# Patient Record
Sex: Male | Born: 1965 | Hispanic: No | Marital: Married | Smoking: Never smoker
Health system: Southern US, Community
[De-identification: ages and names within clinical notes are randomized; demographics above are authoritative.]

## PROBLEM LIST (undated history)

## (undated) DIAGNOSIS — K449 Diaphragmatic hernia without obstruction or gangrene: Secondary | ICD-10-CM

## (undated) DIAGNOSIS — J309 Allergic rhinitis, unspecified: Secondary | ICD-10-CM

## (undated) DIAGNOSIS — K219 Gastro-esophageal reflux disease without esophagitis: Secondary | ICD-10-CM

## (undated) DIAGNOSIS — K759 Inflammatory liver disease, unspecified: Secondary | ICD-10-CM

## (undated) DIAGNOSIS — J45909 Unspecified asthma, uncomplicated: Secondary | ICD-10-CM

## (undated) DIAGNOSIS — T7840XA Allergy, unspecified, initial encounter: Secondary | ICD-10-CM

## (undated) HISTORY — DX: Gastro-esophageal reflux disease without esophagitis: K21.9

## (undated) HISTORY — DX: Unspecified asthma, uncomplicated: J45.909

## (undated) HISTORY — DX: Allergic rhinitis, unspecified: J30.9

## (undated) HISTORY — DX: Diaphragmatic hernia without obstruction or gangrene: K44.9

## (undated) HISTORY — DX: Inflammatory liver disease, unspecified: K75.9

## (undated) HISTORY — DX: Gilbert syndrome: E80.4

## (undated) HISTORY — PX: NO PAST SURGERIES: SHX2092

## (undated) HISTORY — DX: Allergy, unspecified, initial encounter: T78.40XA

---

## 2004-05-30 HISTORY — PX: ESOPHAGOGASTRODUODENOSCOPY: SHX1529

## 2005-03-18 ENCOUNTER — Encounter: Admission: RE | Admit: 2005-03-18 | Discharge: 2005-03-18 | Payer: Self-pay | Admitting: Gastroenterology

## 2016-02-05 ENCOUNTER — Telehealth: Payer: Self-pay | Admitting: Internal Medicine

## 2016-02-05 NOTE — Telephone Encounter (Signed)
Received records from [facility]. Patient states that he is "not satisfied" with Dr. Evette CristalGanem and is requesting to see Dr. Leone PayorGessner. Patient is wanting to be seen for acid reflux. Records placed on Dr. Marvell FullerGessner's desk for review.

## 2016-02-08 NOTE — Telephone Encounter (Signed)
Records reviewed and accepted.  LM on vmail for patient to call back to schedule an OV

## 2016-02-12 ENCOUNTER — Telehealth: Payer: Self-pay

## 2016-02-12 NOTE — Telephone Encounter (Signed)
Left message for patient to call back  

## 2016-02-12 NOTE — Telephone Encounter (Signed)
-----   Message from Iva Booparl E Gessner, MD sent at 02/12/2016  5:31 AM EDT ----- Regarding: work in sooner Please contact him about his problems - former [facility] patient coming to us and see about getting him in sooner than next available  Can use a Friday 9/28 slot if needed  Thank you

## 2016-02-15 ENCOUNTER — Encounter: Payer: Self-pay | Admitting: Internal Medicine

## 2016-02-15 NOTE — Telephone Encounter (Signed)
Left message for patient to call back  

## 2016-02-16 NOTE — Telephone Encounter (Signed)
Patient notified of appt for 02/26/16 3:00

## 2016-02-26 ENCOUNTER — Encounter: Payer: Self-pay | Admitting: Internal Medicine

## 2016-02-26 ENCOUNTER — Ambulatory Visit (INDEPENDENT_AMBULATORY_CARE_PROVIDER_SITE_OTHER): Payer: 59 | Admitting: Internal Medicine

## 2016-02-26 VITALS — BP 100/68 | HR 80 | Ht 74.0 in | Wt 196.2 lb

## 2016-02-26 DIAGNOSIS — R252 Cramp and spasm: Secondary | ICD-10-CM | POA: Diagnosis not present

## 2016-02-26 DIAGNOSIS — Z1211 Encounter for screening for malignant neoplasm of colon: Secondary | ICD-10-CM

## 2016-02-26 DIAGNOSIS — J45909 Unspecified asthma, uncomplicated: Secondary | ICD-10-CM

## 2016-02-26 DIAGNOSIS — K219 Gastro-esophageal reflux disease without esophagitis: Secondary | ICD-10-CM | POA: Diagnosis not present

## 2016-02-26 DIAGNOSIS — K449 Diaphragmatic hernia without obstruction or gangrene: Secondary | ICD-10-CM | POA: Diagnosis not present

## 2016-02-26 MED ORDER — DEXLANSOPRAZOLE 30 MG PO CPDR: 30.0000 mg | DELAYED_RELEASE_CAPSULE | Freq: Every day | ORAL | 3 refills | Status: DC

## 2016-02-26 NOTE — Progress Notes (Signed)
DUC CROCKET 50 y.o. 1965-11-07 [id number]  Assessment & Plan:   1. Gastroesophageal reflux disease, esophagitis presence not specified   2. Hiatal hernia   3. Asthma, chronic, unspecified asthma severity, uncomplicated   4. Cramp of both lower extremities   5. Colon cancer screening     Patient is an signed about leg cramps, he seems to get good control of his reflux symptoms which she says are mainly triggering of asthma, with PPI especially 30 mg Dexilant. He is frustrated over having to take medication and the side effects.   Mg Oxide 400 bid to see if this relieves leg cramps Dexilant 30 mg daily Handout on laparoscopic fundoplication surgery provided he has expressed some interest though I explained he would need additional testing if he were to have a referral for this, i.e. manometry and pH probe testing. RTC next available  Colonoscopy for screening is appropriate, will schedule upon return, we'll hold off for right now as he could potentially need an EGD again. I requested the records of his prior EGD.   Subjective:   Chief Complaint: Reflux  HPI The patient is a very nice 50 year old man with a chronic history of reflux, he takes PPI therapy which relieves wheezing symptoms that he has. He has had a diagnosis of GERD ever since he had some chest pain problems a number of years ago, perhaps around 2007 he had an EGD by Dr. Evette Cristal, it demonstrated a small hiatus hernia. He's been on PPI therapy ever since including Prilosec Nexium AcipHex and the most effective medication for him has been dexilant, originally 60 mg then 30 mg daily with good control of his symptoms. However he had noticed leg cramps. He stopped this medication in the leg cramps went away. He has been taking a multivitamin but that doesn't really relieve the symptoms. He is back on this PPI as intermittent Prilosec did not work as well. Another opinion so he requested an appointment with me. There is no  dysphagia or unintentional weight loss. Does note that alcohol and caffeinated beverages years cause severe symptoms.  Allergies  Allergen Reactions  . Ampicillin-Sulbactam Sodium   . Erythromycin   . Sulfa Antibiotics    No outpatient prescriptions prior to visit.   No facility-administered medications prior to visit.    Past Medical History:  Diagnosis Date  . Allergic rhinitis   . Asthma   . GERD (gastroesophageal reflux disease)   . Gilbert's syndrome   . Hepatitis    ? when a child  . Hiatal hernia    Past Surgical History:  Procedure Laterality Date  . NO PAST SURGERIES     Social History   Social History  . Marital status: Married    Spouse name: N/A  . Number of children: 2  . Years of education: N/A   Occupational History  . engineer    Social History Main Topics  . Smoking status: Never Smoker  . Smokeless tobacco: Never Used  . Alcohol use Yes     Comment: social  . Drug use: No  . Sexual activity: Not Asked   Other Topics Concern  . None   Social History Narrative  . None   Family History  Problem Relation Age of Onset  . Heart attack Father   . CAD Father   . Cancer Father     laryngeal  . Hypertension Father   . Kidney Stones Brother        Review of  Systems As above. All other review of systems are negative.  Objective:   Physical Exam @BP  100/68 (BP Location: Left Arm, Patient Position: Sitting, Cuff Size: Normal)   Pulse 80   Ht 6\' 2"  (1.88 m) Comment: height measured without shoes  Wt 196 lb 4 oz (89 kg)   BMI 25.20 kg/m @  General:  Well-developed, well-nourished and in no acute distress Eyes:  anicteric. ENT:   Mouth and posterior pharynx free of lesions.  Neck:   supple w/o thyromegaly or mass.  Lungs: Clear to auscultation bilaterally. Heart:  S1S2, no rubs, murmurs, gallops. Abdomen:  soft, non-tender, no hepatosplenomegaly, hernia, or mass and BS+.   Lymph:  no cervical or supraclavicular  adenopathy. Extremities:   no edema, cyanosis or clubbing Skin   no rash. Neuro:  A&O x 3.  Psych:  appropriate mood and  Affect.   Data Reviewed:  Several years of [facility] notes.

## 2016-02-26 NOTE — Patient Instructions (Addendum)
   We will obtain your EGD report from Dr Evette CristalGanem for review.   Today we are giving you information to read on correction of acid reflux by laparoscopy.    We have sent the following medications to your pharmacy for you to pick up at your convenience: Dexilant   Per Dr Leone PayorGessner please take over the counter magnesium oxide 400mg  twice a day.    Follow up with Dr Leone PayorGessner on 05/05/2016 at 8:30AM     I appreciate the opportunity to care for you. Stan Headarl Gessner, MD, [facility]

## 2016-04-29 ENCOUNTER — Ambulatory Visit: Payer: Self-pay | Admitting: Internal Medicine

## 2016-05-04 ENCOUNTER — Encounter: Payer: Self-pay | Admitting: Gastroenterology

## 2016-05-05 ENCOUNTER — Encounter: Payer: Self-pay | Admitting: Internal Medicine

## 2016-05-05 ENCOUNTER — Ambulatory Visit (INDEPENDENT_AMBULATORY_CARE_PROVIDER_SITE_OTHER): Payer: 59 | Admitting: Internal Medicine

## 2016-05-05 VITALS — BP 100/60 | HR 72 | Ht 74.0 in | Wt 196.5 lb

## 2016-05-05 DIAGNOSIS — K219 Gastro-esophageal reflux disease without esophagitis: Secondary | ICD-10-CM

## 2016-05-05 DIAGNOSIS — Z1211 Encounter for screening for malignant neoplasm of colon: Secondary | ICD-10-CM

## 2016-05-05 NOTE — Progress Notes (Signed)
   Samuel Roy 50 y.o. 02/28/1966 [id number]  Assessment & Plan:   Encounter Diagnoses  Name Primary?  . Gastroesophageal reflux disease, esophagitis presence not specified Yes  . Colon cancer screening     Stay on dexilant + MG to treat cramps Screening colonoscopy - we discused all options. The risks and benefits as well as alternatives of endoscopic procedure(s) have been discussed and reviewed. All questions answered. The patient agrees to proceed.  I appreciate the opportunity to care for this patient.    Subjective:   Chief Complaint: f/u reflux  HPI Heartburn fairly well-controlled now 0n 30 mg Dexilant qd. occ breakthrough. Mg supplement relieves most of mm cramps. Overall he is satisfied.  Prior EGD 2006 small hiatal hernia and mild bx esophagitis  He says he has eliminated almost all caffeine  Medications, allergies, past medical history, past surgical history, family history and social history are reviewed and updated in the EMR.  Review of Systems As above  Objective:   Physical Exam BP 100/60 (BP Location: Left Arm, Patient Position: Sitting, Cuff Size: Normal)   Pulse 72   Ht 6\' 2"  (1.88 m)   Wt 196 lb 8 oz (89.1 kg)   BMI 25.23 kg/m  NAD  15 minutes time spent with patient > half in counseling coordination of care

## 2016-05-05 NOTE — Patient Instructions (Addendum)
   You have been scheduled for a colonoscopy. Please follow written instructions given to you at your visit today.  Please pick up your prep supplies at the pharmacy. If you use inhalers (even only as needed), please bring them with you on the day of your procedure. Your physician has requested that you go to www.startemmi.com and enter the access code given to you at your visit today. This web site gives a general overview about your procedure. However, you should still follow specific instructions given to you by our office regarding your preparation for the procedure.   We are providing you with a Dexilant coupon today.  I appreciate the opportunity to care for you. Stan Headarl Gessner, MD, [facility]

## 2016-06-16 ENCOUNTER — Encounter: Payer: Self-pay | Admitting: Internal Medicine

## 2016-06-30 ENCOUNTER — Encounter: Payer: Self-pay | Admitting: Internal Medicine

## 2016-06-30 ENCOUNTER — Ambulatory Visit (AMBULATORY_SURGERY_CENTER): Payer: 59 | Admitting: Internal Medicine

## 2016-06-30 VITALS — BP 94/60 | HR 69 | Temp 98.0°F | Resp 18 | Ht 74.0 in | Wt 196.0 lb

## 2016-06-30 DIAGNOSIS — Z1211 Encounter for screening for malignant neoplasm of colon: Secondary | ICD-10-CM | POA: Diagnosis present

## 2016-06-30 DIAGNOSIS — Z1212 Encounter for screening for malignant neoplasm of rectum: Secondary | ICD-10-CM | POA: Diagnosis not present

## 2016-06-30 MED ORDER — SODIUM CHLORIDE 0.9 % IV SOLN: 500.0000 mL | INTRAVENOUS | Status: AC

## 2016-06-30 NOTE — Patient Instructions (Addendum)
   The colonoscopy was normal. Next routine colonoscopy in 10 years - 2028.  I appreciate the opportunity to care for you. Samuel Booparl E. Brantlee Hinde, MD, FACG  YOU HAD AN ENDOSCOPIC PROCEDURE TODAY AT THE [facility]:   Refer to the procedure report that was given to you for any specific questions about what was found during the examination.  If the procedure report does not answer your questions, please call your gastroenterologist to clarify.  If you requested that your care partner not be given the details of your procedure findings, then the procedure report has been included in a sealed envelope for you to review at your convenience later.  YOU SHOULD EXPECT: Some feelings of bloating in the abdomen. Passage of more gas than usual.  Walking can help get rid of the air that was put into your GI tract during the procedure and reduce the bloating. If you had a lower endoscopy (such as a colonoscopy or flexible sigmoidoscopy) you may notice spotting of blood in your stool or on the toilet paper. If you underwent a bowel prep for your procedure, you may not have a normal bowel movement for a few days.  Please Note:  You might notice some irritation and congestion in your nose or some drainage.  This is from the oxygen used during your procedure.  There is no need for concern and it should clear up in a day or so.  SYMPTOMS TO REPORT IMMEDIATELY:   Following lower endoscopy (colonoscopy or flexible sigmoidoscopy):  Excessive amounts of blood in the stool  Significant tenderness or worsening of abdominal pains  Swelling of the abdomen that is new, acute  Fever of 100F or higher   For urgent or emergent issues, a gastroenterologist can be reached at any hour by calling [phone].   DIET:  We do recommend a small meal at first, but then you may proceed to your regular diet.  Drink plenty of fluids but you should avoid alcoholic beverages for 24 hours.  ACTIVITY:  You should plan  to take it easy for the rest of today and you should NOT DRIVE or use heavy machinery until tomorrow (because of the sedation medicines used during the test).    FOLLOW UP: Our staff will call the number listed on your records the next business day following your procedure to check on you and address any questions or concerns that you may have regarding the information given to you following your procedure. If we do not reach you, we will leave a message.  However, if you are feeling well and you are not experiencing any problems, there is no need to return our call.  We will assume that you have returned to your regular daily activities without incident.  If any biopsies were taken you will be contacted by phone or by letter within the next 1-3 weeks.  Please call us at [phone] if you have not heard about the biopsies in 3 weeks.    SIGNATURES/CONFIDENTIALITY: You and/or your care partner have signed paperwork which will be entered into your electronic medical record.  These signatures attest to the fact that that the information above on your After Visit Summary has been reviewed and is understood.  Full responsibility of the confidentiality of this discharge information lies with you and/or your care-partner.

## 2016-06-30 NOTE — Op Note (Signed)
[facility] Patient Name: Samuel Roy Procedure Date: 06/30/2016 8:00 AM MRN: [id number] Endoscopist: Iva Boop , MD Age: 51 Referring MD:  Date of Birth: 1965/12/09 Gender: Male Account #: [id number] Procedure:                Colonoscopy Indications:              Screening for colorectal malignant neoplasm Medicines:                Propofol per Anesthesia, Monitored Anesthesia Care Procedure:                Pre-Anesthesia Assessment:                           - Prior to the procedure, a History and Physical                            was performed, and patient medications and                            allergies were reviewed. The patient's tolerance of                            previous anesthesia was also reviewed. The risks                            and benefits of the procedure and the sedation                            options and risks were discussed with the patient.                            All questions were answered, and informed consent                            was obtained. Prior Anticoagulants: The patient has                            taken no previous anticoagulant or antiplatelet                            agents. ASA Grade Assessment: II - A patient with                            mild systemic disease. After reviewing the risks                            and benefits, the patient was deemed in                            satisfactory condition to undergo the procedure.                           After obtaining informed consent, the colonoscope  was passed under direct vision. Throughout the                            procedure, the patient's blood pressure, pulse, and                            oxygen saturations were monitored continuously. The                            Model CF-HQ190L [id number](SN#[id number]) scope was introduced                            through the anus and advanced to the the cecum,                             identified by appendiceal orifice and ileocecal                            valve. The quality of the bowel preparation was                            good. The colonoscopy was performed without                            difficulty. The patient tolerated the procedure                            well. The bowel preparation used was Miralax. The                            ileocecal valve, appendiceal orifice, and rectum                            were photographed. Scope In: 8:13:57 AM Scope Out: 8:26:56 AM Scope Withdrawal Time: 0 hours 11 minutes 4 seconds  Total Procedure Duration: 0 hours 12 minutes 59 seconds  Findings:                 The perianal and digital rectal examinations were                            normal. Pertinent negatives include normal prostate                            (size, shape, and consistency).                           The colon (entire examined portion) appeared normal.                           No additional abnormalities were found on                            retroflexion. Complications:            No immediate complications. Estimated  blood loss:                            None. Estimated Blood Loss:     Estimated blood loss: none. Recommendation:           - Repeat colonoscopy in 10 years for screening                            purposes.                           - Patient has a contact number available for                            emergencies. The signs and symptoms of potential                            delayed complications were discussed with the                            patient. Return to normal activities tomorrow.                            Written discharge instructions were provided to the                            patient.                           - Resume previous diet.                           - Continue present medications. Iva Boop, MD 06/30/2016 8:32:20 AM This report has been signed electronically.

## 2016-06-30 NOTE — Progress Notes (Signed)
A and O x3. Report to RN. Tolerated MAC anesthesia well.

## 2016-07-01 ENCOUNTER — Telehealth: Payer: Self-pay

## 2016-07-01 NOTE — Telephone Encounter (Signed)
  Follow up Call-  Call back number 06/30/2016  Post procedure Call Back phone  # [phone]  Permission to leave phone message Yes  Some recent data might be hidden     Patient questions:  Do you have a fever, pain , or abdominal swelling? No. Pain Score  0 *  Have you tolerated food without any problems? Yes.    Have you been able to return to your normal activities? Yes.    Do you have any questions about your discharge instructions: Diet   No. Medications  No. Follow up visit  No.  Do you have questions or concerns about your Care? No.  Actions: * If pain score is 4 or above: No action needed, pain <4.

## 2016-10-21 ENCOUNTER — Other Ambulatory Visit: Payer: Self-pay | Admitting: Internal Medicine

## 2016-10-21 ENCOUNTER — Telehealth: Payer: Self-pay | Admitting: Internal Medicine

## 2016-10-21 MED ORDER — DEXLANSOPRAZOLE 30 MG PO CPDR: 30.0000 mg | DELAYED_RELEASE_CAPSULE | Freq: Every day | ORAL | 3 refills | Status: AC

## 2016-10-21 NOTE — Telephone Encounter (Signed)
[facility] and prescription is not ready yet but it has been received. Left voicemail to explain to patient.

## 2016-10-21 NOTE — Telephone Encounter (Signed)
Spoke to patient and informed him we sent a 90 day Rx to [facility].

## 2020-04-14 ENCOUNTER — Encounter (HOSPITAL_COMMUNITY): Payer: Self-pay

## 2020-04-14 ENCOUNTER — Emergency Department (HOSPITAL_COMMUNITY): Payer: Managed Care, Other (non HMO)

## 2020-04-14 ENCOUNTER — Other Ambulatory Visit: Payer: Self-pay

## 2020-04-14 ENCOUNTER — Emergency Department (HOSPITAL_COMMUNITY)
Admission: EM | Admit: 2020-04-14 | Discharge: 2020-04-14 | Disposition: A | Discharge status: Home / Self Care | Payer: Managed Care, Other (non HMO) | Attending: Emergency Medicine | Admitting: Emergency Medicine

## 2020-04-14 DIAGNOSIS — J45909 Unspecified asthma, uncomplicated: Secondary | ICD-10-CM | POA: Diagnosis not present

## 2020-04-14 DIAGNOSIS — R531 Weakness: Secondary | ICD-10-CM | POA: Diagnosis not present

## 2020-04-14 DIAGNOSIS — Z7951 Long term (current) use of inhaled steroids: Secondary | ICD-10-CM | POA: Diagnosis not present

## 2020-04-14 DIAGNOSIS — R112 Nausea with vomiting, unspecified: Secondary | ICD-10-CM | POA: Diagnosis not present

## 2020-04-14 DIAGNOSIS — R42 Dizziness and giddiness: Secondary | ICD-10-CM | POA: Diagnosis present

## 2020-04-14 DIAGNOSIS — Z20822 Contact with and (suspected) exposure to covid-19: Secondary | ICD-10-CM | POA: Diagnosis not present

## 2020-04-14 LAB — BASIC METABOLIC PANEL
Anion gap: 9 (ref 5–15)
BUN: 15 mg/dL (ref 6–20)
CO2: 22 mmol/L (ref 22–32)
Calcium: 9.2 mg/dL (ref 8.9–10.3)
Chloride: 104 mmol/L (ref 98–111)
Creatinine, Ser: 0.91 mg/dL (ref 0.61–1.24)
GFR, Estimated: >60 mL/min (ref >60)
Glucose, Bld: 131 mg/dL — ABNORMAL HIGH (ref 70–99)
Potassium: 3.6 mmol/L (ref 3.5–5.1)
Sodium: 135 mmol/L (ref 135–145)

## 2020-04-14 LAB — CBC
HCT: 42.4 % (ref 39.0–52.0)
Hemoglobin: 14.8 g/dL (ref 13.0–17.0)
MCH: 30.7 pg (ref 26.0–34.0)
MCHC: 34.9 g/dL (ref 30.0–36.0)
MCV: 88 fL (ref 80.0–100.0)
Platelets: 194 10*3/uL (ref 150–400)
RBC: 4.82 MIL/uL (ref 4.22–5.81)
RDW: 10.6 % — ABNORMAL LOW (ref 11.5–15.5)
WBC: 6.1 10*3/uL (ref 4.0–10.5)
nRBC: 0 % (ref 0.0–0.2)

## 2020-04-14 LAB — URINALYSIS, ROUTINE W REFLEX MICROSCOPIC
Bilirubin Urine: NEGATIVE
Glucose, UA: NEGATIVE mg/dL
Hgb urine dipstick: NEGATIVE
Ketones, ur: NEGATIVE mg/dL
Leukocytes,Ua: NEGATIVE
Nitrite: NEGATIVE
Protein, ur: NEGATIVE mg/dL
Specific Gravity, Urine: 1.021 (ref 1.005–1.030)
pH: 8 (ref 5.0–8.0)

## 2020-04-14 LAB — RESPIRATORY PANEL BY RT PCR (FLU A&B, COVID)
Influenza A by PCR: NEGATIVE
Influenza B by PCR: NEGATIVE
SARS Coronavirus 2 by RT PCR: NEGATIVE

## 2020-04-14 LAB — CBG MONITORING, ED: Glucose-Capillary: 146 mg/dL — ABNORMAL HIGH (ref 70–99)

## 2020-04-14 MED ORDER — ONDANSETRON 4 MG PO TBDP: 4.0000 mg | ORAL_TABLET | Freq: Three times a day (TID) | ORAL | 0 refills | Status: AC | PRN

## 2020-04-14 MED ORDER — MECLIZINE HCL 25 MG PO TABS
25.0000 mg | ORAL_TABLET | Freq: Once | ORAL | Status: AC
  Administered 2020-04-14: 25 mg via ORAL
  Filled 2020-04-14: qty 1

## 2020-04-14 MED ORDER — MECLIZINE HCL 25 MG PO TABS: 25.0000 mg | ORAL_TABLET | Freq: Three times a day (TID) | ORAL | 0 refills | Status: AC | PRN

## 2020-04-14 MED ORDER — MECLIZINE HCL 25 MG PO TABS: 25.0000 mg | ORAL_TABLET | Freq: Once | ORAL | Status: DC

## 2020-04-14 MED ORDER — ONDANSETRON HCL 4 MG/2ML IJ SOLN: 4.0000 mg | Freq: Once | INTRAMUSCULAR | Status: DC

## 2020-04-14 MED ORDER — LORAZEPAM 2 MG/ML IJ SOLN
1.0000 mg | Freq: Once | INTRAMUSCULAR | Status: AC
  Administered 2020-04-14: 1 mg via INTRAVENOUS
  Filled 2020-04-14: qty 1

## 2020-04-14 MED ORDER — SODIUM CHLORIDE 0.9 % IV BOLUS
1000.0000 mL | Freq: Once | INTRAVENOUS | Status: AC
  Administered 2020-04-14: 1000 mL via INTRAVENOUS

## 2020-04-14 NOTE — Discharge Instructions (Signed)
Please pick up medications and take as needed for symptom relief Follow up with both your PCP and neurology regarding your ED visit. Neurology may take some time to schedule an appointment and you can see your PCP in the meantime.   Return to the ED for any worsening symptoms including worsening dizziness, persistent vomiting, speech changes, confusion, weakness/numbness on one side of your body, severe headache, fevers > 100.4, or any other new/concerning symptoms

## 2020-04-14 NOTE — ED Triage Notes (Signed)
Patient arrived stating he woke up from his sleep and felt like the room was spinning. Reports light sensitivity and reports only feeling comfortable looking forward. Has had nausea and vomiting. States some tingling in his fingers.

## 2020-04-14 NOTE — ED Provider Notes (Signed)
[facility]-EMERGENCY DEPT Provider Note   CSN: [id number] Arrival date & time: 04/14/20  0556     History Chief Complaint  Patient presents with  . Weakness    Samuel Roy is a 54 y.o. male with PMHx asthma, allergic rhinitis, GERD, Gilbert's syndrome who presents to the ED with complaint of sudden onset, constant, room spinning dizziness that woke him up out of his sleep earlier this morning around 1 AM. Pt reports worsening dizziness with movement of any kind. He also reports nausea and TNTC episodes of NBNB emesis. Pt reports any time he vomits he feels a tingling sensation in his fingers and toes. He has never had symptoms like this before. Felt fine upon going to bed last night. Pt did not take anything for his symptoms prior to arrival. He does mention he had a lot of sneezing earlier this week otherwise felt at baseline. Denies fevers, chills, headache, blurry vision, double vision, unilateral weakness/numbness, abdominal pain, cough, SOB, confusion, speech changes, or any other associated symptoms.   The history is provided by the patient and medical records.       Past Medical History:  Diagnosis Date  . Allergic rhinitis   . Allergy   . Asthma   . GERD (gastroesophageal reflux disease)   . Gilbert's syndrome   . Hepatitis    ? when a child  . Hiatal hernia     There are no problems to display for this patient.   Past Surgical History:  Procedure Laterality Date  . ESOPHAGOGASTRODUODENOSCOPY  2006  . NO PAST SURGERIES         Family History  Problem Relation Age of Onset  . Heart attack Father   . CAD Father   . Cancer Father        laryngeal  . Hypertension Father   . Kidney Stones Brother     Social History   Tobacco Use  . Smoking status: Never Smoker  . Smokeless tobacco: Never Used  Substance Use Topics  . Alcohol use: Yes    Comment: social  . Drug use: No    Home Medications Prior to Admission medications    Medication Sig Start Date End Date Taking? Authorizing Provider  albuterol (PROVENTIL HFA;VENTOLIN HFA) 108 (90 Base) MCG/ACT inhaler Inhale 1 puff into the lungs daily as needed for wheezing or shortness of breath.    [provider]  cetirizine-pseudoephedrine (ZYRTEC-D) 5-120 MG tablet Take 1 tablet by mouth daily as needed for allergies.    [provider]  Dexlansoprazole (DEXILANT) 30 MG capsule Take 1 capsule (30 mg total) by mouth daily. 10/21/16   Iva Boop, MD  Fluticasone-Salmeterol (ADVAIR) 100-50 MCG/DOSE AEPB Inhale 1 puff into the lungs 2 (two) times daily.    [provider]  meclizine (ANTIVERT) 25 MG tablet Take 1 tablet (25 mg total) by mouth 3 (three) times daily as needed for dizziness. 04/14/20   Tanda Rockers, PA-C  Multiple Vitamin (MULTIVITAMIN) tablet Take 1 tablet by mouth daily.    [provider]  ondansetron (ZOFRAN ODT) 4 MG disintegrating tablet Take 1 tablet (4 mg total) by mouth every 8 (eight) hours as needed for nausea or vomiting. 04/14/20   Tanda Rockers, PA-C    Allergies    Ampicillin-sulbactam sodium, Erythromycin, and Sulfa antibiotics  Review of Systems   Review of Systems  Constitutional: Negative for chills and fever.  Eyes: Negative for visual disturbance.  Respiratory: Negative for cough and  shortness of breath.   Gastrointestinal: Positive for nausea and vomiting. Negative for abdominal pain, constipation and diarrhea.  Neurological: Positive for dizziness. Negative for syncope, facial asymmetry, speech difficulty, weakness, numbness and headaches.  All other systems reviewed and are negative.   Physical Exam Updated Vital Signs BP 134/89 (BP Location: Right Arm)   Pulse 99   Temp 98.1 F (36.7 C) (Oral)   Resp 19   Ht 6\' 3"  (1.905 m)   Wt 81.6 kg   SpO2 100%   BMI 22.50 kg/m   Physical Exam Vitals and nursing note reviewed.  Constitutional:      Appearance: He is not diaphoretic.      Comments: Uncomfortable appearing male; sitting in darkened room with towel over his eyes  HENT:     Head: Normocephalic and atraumatic.     Right Ear: Tympanic membrane normal.     Left Ear: Tympanic membrane normal.  Eyes:     Extraocular Movements: Extraocular movements intact.     Conjunctiva/sclera: Conjunctivae normal.     Pupils: Pupils are equal, round, and reactive to light.     Comments: No nystagmus appreciated  Cardiovascular:     Rate and Rhythm: Normal rate and regular rhythm.     Pulses: Normal pulses.  Pulmonary:     Effort: Pulmonary effort is normal.     Breath sounds: Normal breath sounds. No wheezing or rales.  Abdominal:     Palpations: Abdomen is soft.     Tenderness: There is no abdominal tenderness. There is no guarding or rebound.  Musculoskeletal:     Cervical back: Neck supple. No rigidity.  Skin:    General: Skin is warm and dry.  Neurological:     Mental Status: He is alert.     Comments: CN 3-12 grossly intact A&O x4 GCS 15 Sensation and strength intact Gait nonataxic including with tandem walking Coordination with finger-to-nose WNL Neg romberg, neg pronator drift     ED Results / Procedures / Treatments   Labs (all labs ordered are listed, but only abnormal results are displayed) Labs Reviewed  BASIC METABOLIC PANEL - Abnormal; Notable for the following components:      Result Value   Glucose, Bld 131 (*)    All other components within normal limits  CBC - Abnormal; Notable for the following components:   RDW 10.6 (*)    All other components within normal limits  CBG MONITORING, ED - Abnormal; Notable for the following components:   Glucose-Capillary 146 (*)    All other components within normal limits  RESPIRATORY PANEL BY RT PCR (FLU A&B, COVID)  URINALYSIS, ROUTINE W REFLEX MICROSCOPIC    EKG EKG Interpretation  Date/Time:  Tuesday April 14 2020 06:05:38 EST Ventricular Rate:  99 PR Interval:    QRS Duration: 88 QT  Interval:  381 QTC Calculation: 489 R Axis:   60 Text Interpretation: Normal sinus rhythm Borderline prolonged QT interval No old tracing to compare Confirmed by [id number] (Devoria Albe) on 04/14/2020 6:13:53 AM   Radiology CT Head Wo Contrast  Result Date: 04/14/2020 CLINICAL DATA:  Dizziness, nonspecific. EXAM: CT HEAD WITHOUT CONTRAST TECHNIQUE: Contiguous axial images were obtained from the base of the skull through the vertex without intravenous contrast. COMPARISON:  No pertinent prior exams are available for comparison. FINDINGS: Brain: Cerebral volume is normal for age. There is no acute intracranial hemorrhage. No demarcated cortical infarct. No extra-axial fluid collection. No evidence of intracranial mass. No midline shift. Vascular:  No hyperdense vessel. Skull: Normal. Negative for fracture or focal lesion. Sinuses/Orbits: Visualized orbits show no acute finding. Mild ethmoid and sphenoid sinus mucosal thickening. IMPRESSION: No evidence of acute intracranial abnormality. Ethmoid and sphenoid sinusitis. Electronically Signed   By: Jackey LogeKyle  Golden DO   On: 04/14/2020 07:47    Procedures Procedures (including critical care time)  Medications Ordered in ED Medications  sodium chloride 0.9 % bolus 1,000 mL (0 mLs Intravenous Stopped 04/14/20 0928)  LORazepam (ATIVAN) injection 1 mg (1 mg Intravenous Given 04/14/20 0806)  meclizine (ANTIVERT) tablet 25 mg (25 mg Oral Given 04/14/20 [id number])    ED Course  I have reviewed the triage vital signs and the nursing notes.  Pertinent labs & imaging results that were available during my care of the patient were reviewed by me and considered in my medical decision making (see chart for details).    MDM Rules/Calculators/A&P                          54 year old male presents to the ED today complaining of sudden onset room spinning dizziness that woke him out of his sleep earlier today (1 AM) with associated nausea and nonbloody nonbilious emesis.   Is worse with any movement of any kind.  Vitals are stable on arrival, febrile, nontachycardic nontachypneic.  Pressure normotensive at 134/89.  As I enter the room patient is sitting in a darkened room with a towel over his head and emesis bag in hand.  He appears uncomfortable.  He has no obvious focal neuro deficits on exam today and no meningeal signs; denies headache, confusion, speech changes, unilateral weakness or numbness. There are no signs of nystagmus however given that he does not want to move his head around there is concern for benign positional vertigo. In the setting of new symptoms will plan for CT Head to assess for bleed causing sudden symptoms; may consider MRI as well if any abnormality on CT scan/if no improvement with treatment. Doubt stroke at this time however symptoms have been ongoing for > 5 hours at this time. Will treat with fluids and ativan with reevaluation once pt is more comfortable. He does mention having increased amount of sneezing earlier in the week prior to sx starting - TMs clear on exam; no signs of infection causing symptoms. Will swab for COVID.   CBC without leukocytosis. Hgb stable at 14.8.  BMP with glucose 131. No other findings a tthis time.  U/A negative for infection.  CT Head negative  Nursing staff reports pt's wife is in the room and has questions - have updated her on the exam findings at this time and treatment course. Fluids still running at this time however pt reports improvement in symptoms after fluids and ativan. Has not had anymore nausea or vomiting. Will plan to reassess once fluids are done running and ambulate patient; if he is able to ambulate without difficulty will plan to discharge with meclizine prescription and PCP/neurology follow up.   Pt ambulated successfully without unstable gait. Reports improvement in symptoms however still mildly dizzy. Will fluid challenge at this time and provide oral meclizine.   Pt able to tolerate  water and graham crackers without difficulty. Will discharge home with meclizine prescription and close PCP/neuro follow up. Pt also requesting nausea medication; will provide. Strict return precautions discussed today - pt is in agreement with plan and stable for discharge.   This note was prepared using [employer] voice recognition  software and may include unintentional dictation errors due to the inherent limitations of voice recognition software.  Final Clinical Impression(s) / ED Diagnoses Final diagnoses:  Vertigo    Rx / DC Orders ED Discharge Orders         Ordered    meclizine (ANTIVERT) 25 MG tablet  3 times daily PRN        04/14/20 1007    ondansetron (ZOFRAN ODT) 4 MG disintegrating tablet  Every 8 hours PRN        04/14/20 1007           Discharge Instructions     Please pick up medications and take as needed for symptom relief Follow up with both your PCP and neurology regarding your ED visit. Neurology may take some time to schedule an appointment and you can see your PCP in the meantime.   Return to the ED for any worsening symptoms including worsening dizziness, persistent vomiting, speech changes, confusion, weakness/numbness on one side of your body, severe headache, fevers > 100.4, or any other new/concerning symptoms       Tanda Rockers, PA-C 04/14/20 1009    Gwyneth Sprout, MD 04/16/20 1019

## 2020-07-02 ENCOUNTER — Ambulatory Visit: Payer: Self-pay | Admitting: Neurology

## 2022-05-25 IMAGING — CT CT HEAD W/O CM
2 series · 15 of 40 positions shown, 18 images · non-contrast
Comparison: No pertinent prior exams are available for comparison.

CLINICAL DATA: Dizziness, nonspecific.

EXAM:
CT HEAD WITHOUT CONTRAST
TECHNIQUE: Contiguous axial images were obtained from the base of the skull
through the vertex without intravenous contrast.

[Series 2: head wo · axial · 0.47mm/px · z∈[-193,-53]mm · 12 of 34 slices shown, 15 images]
[im 3/34  brain]
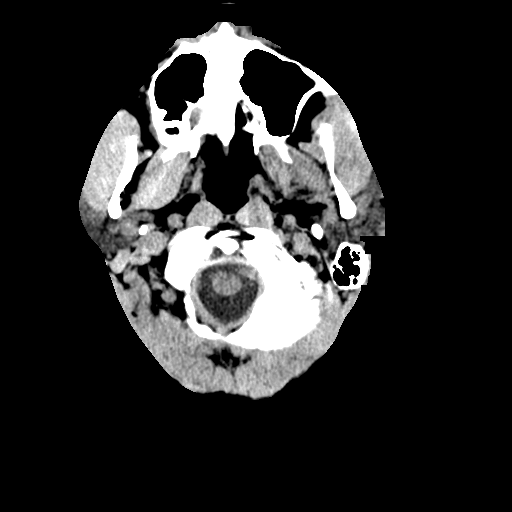
[im 3/34  bone]
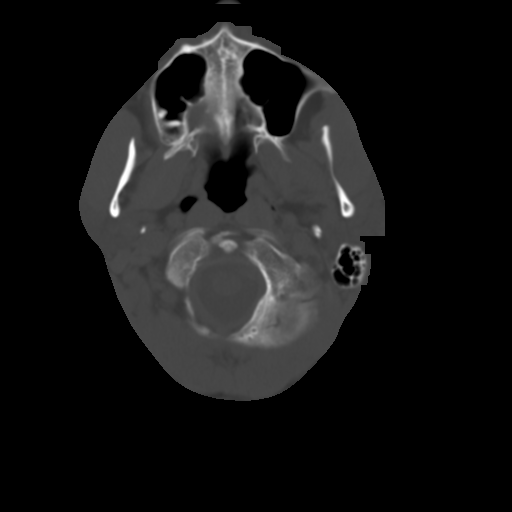
[im 5/34  brain]
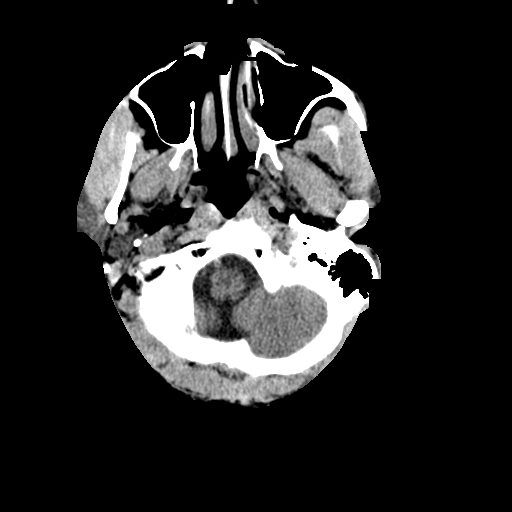
[im 7/34  brain]
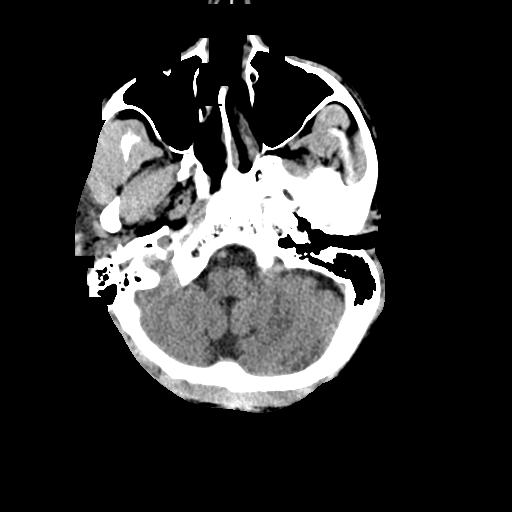
[im 11/34  brain]
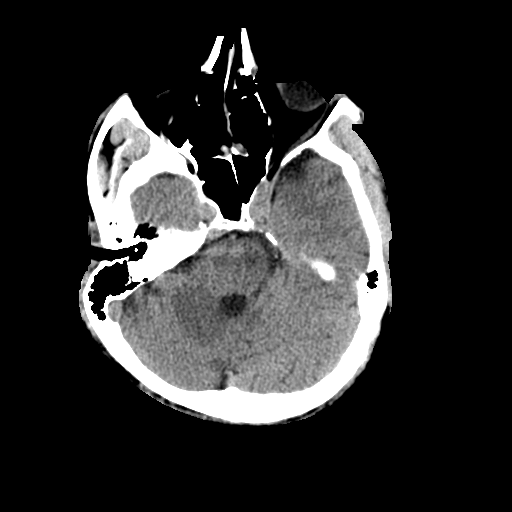
[im 13/34  brain]
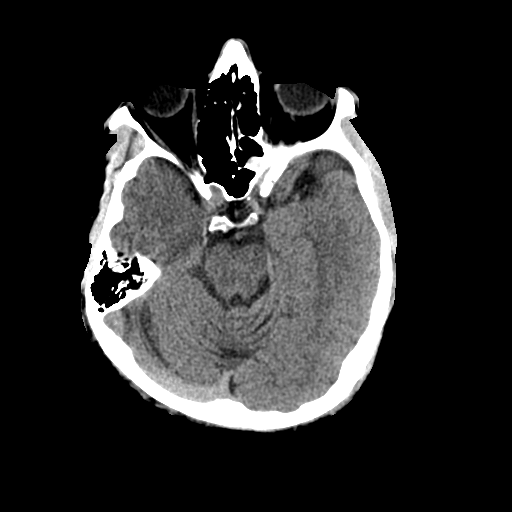
[im 13/34  bone]
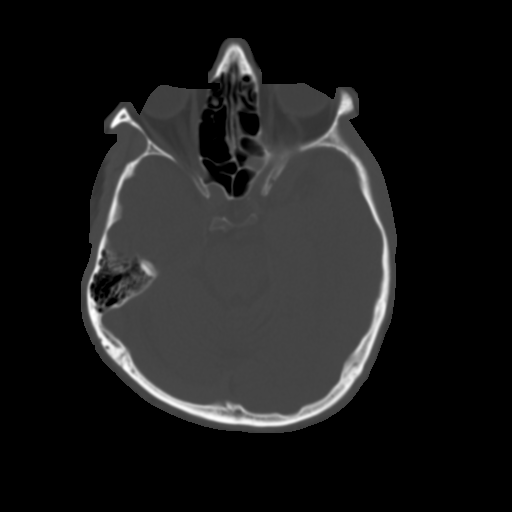
[im 15/34  brain]
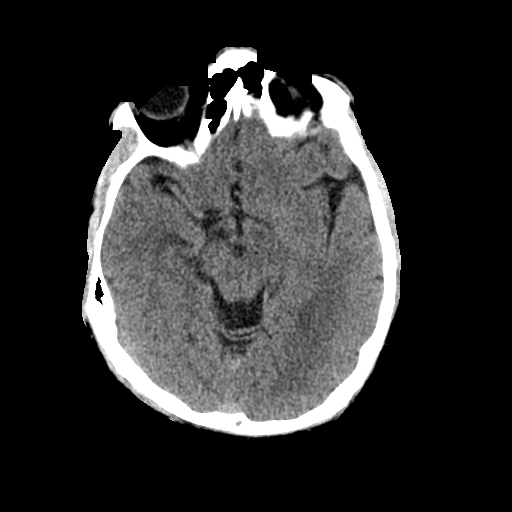
[im 19/34  brain]
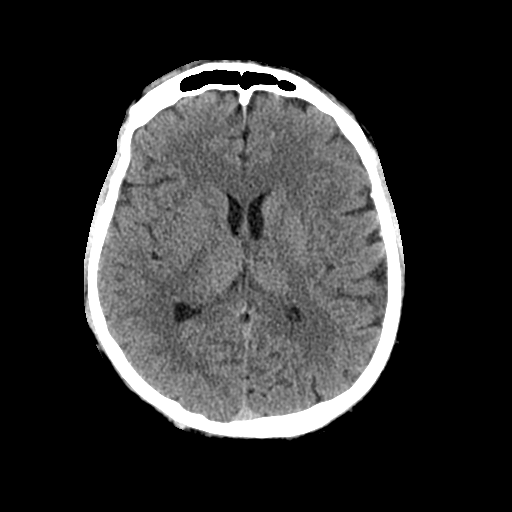
[im 21/34  brain]
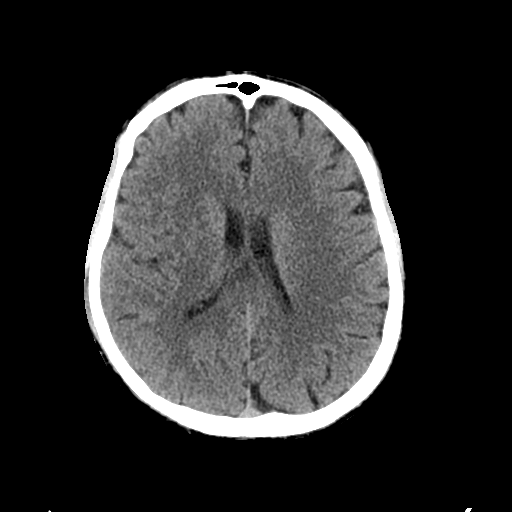
[im 23/34  brain]
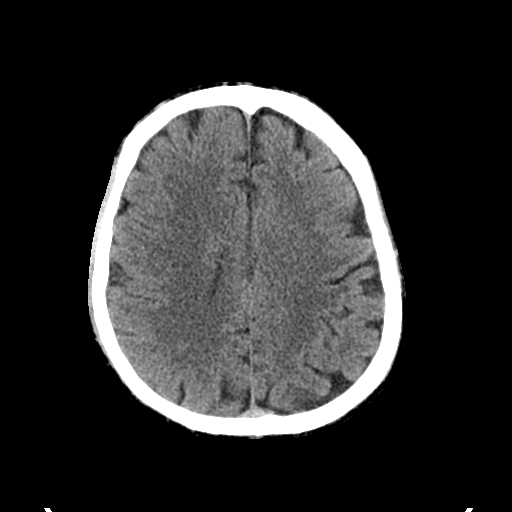
[im 23/34  bone]
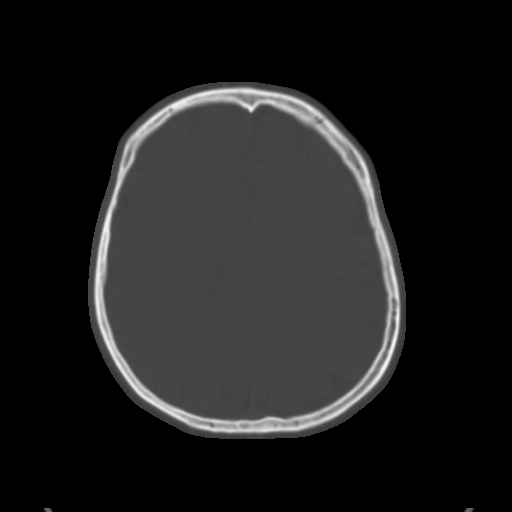
[im 27/34  brain]
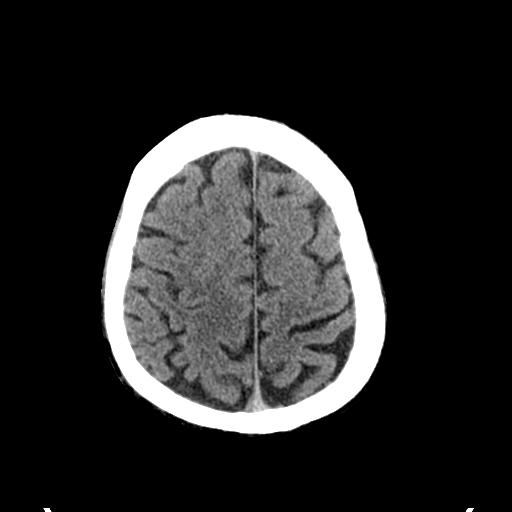
[im 29/34  brain]
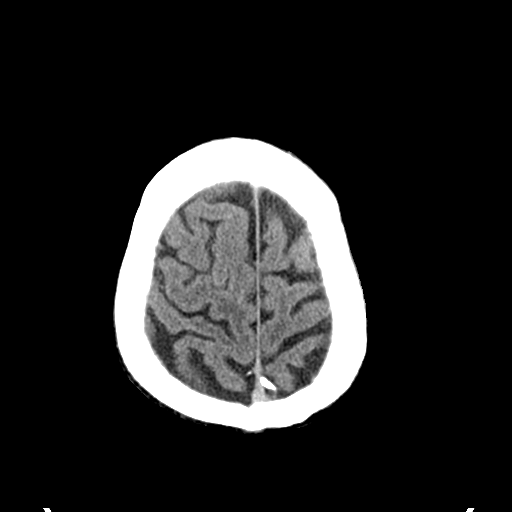
[im 31/34  brain]
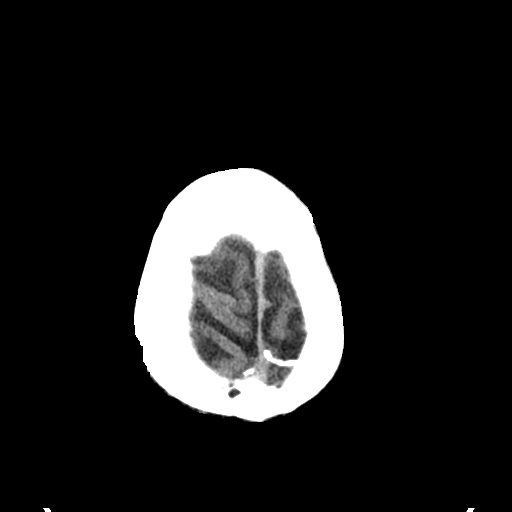

[Series 4: coronal soft tissue · coronal · 0.32mm/px · 3 of 84 slices shown]
[im 30/84  brain]
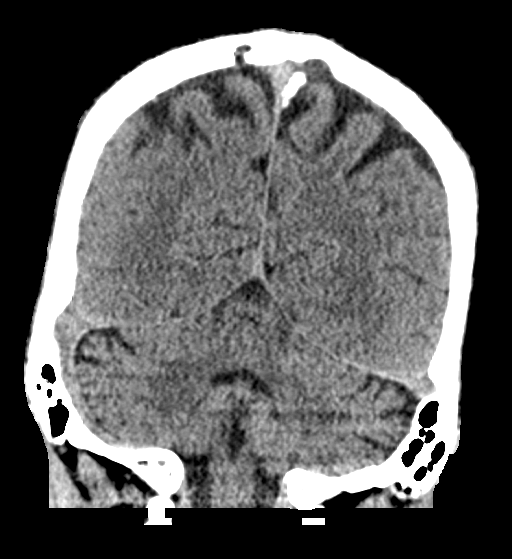
[im 38/84  brain]
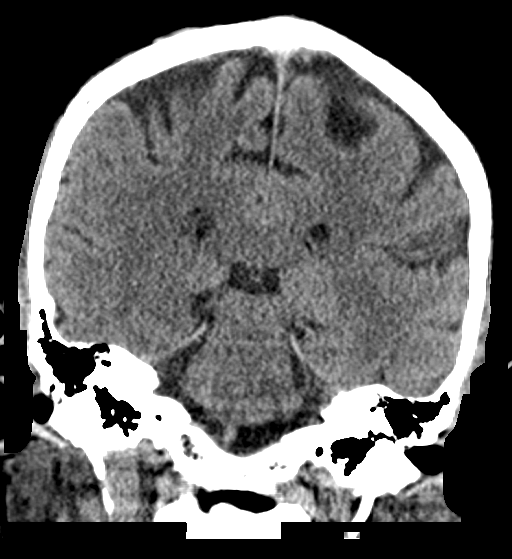
[im 46/84  brain]
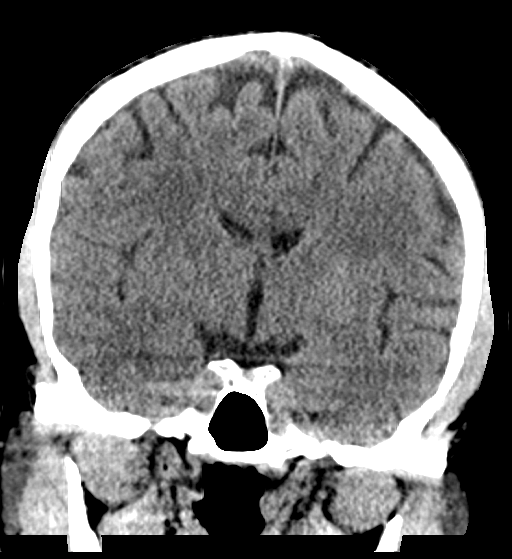

[15 of 40 positions shown; findings below may reference images not displayed]

FINDINGS: Brain:

Cerebral volume is normal for age.

There is no acute intracranial hemorrhage.

No demarcated cortical infarct.

No extra-axial fluid collection.

No evidence of intracranial mass.

No midline shift.

Vascular: No hyperdense vessel.

Skull: Normal. Negative for fracture or focal lesion.

Sinuses/Orbits: Visualized orbits show no acute finding. Mild
ethmoid and sphenoid sinus mucosal thickening.
IMPRESSION: No evidence of acute intracranial abnormality.

Ethmoid and sphenoid sinusitis.

## 2023-07-31 ENCOUNTER — Emergency Department (HOSPITAL_COMMUNITY)

## 2023-07-31 ENCOUNTER — Emergency Department (HOSPITAL_COMMUNITY)
Admission: EM | Admit: 2023-07-31 | Discharge: 2023-07-31 | Disposition: A | Discharge status: Home / Self Care | Attending: Emergency Medicine | Admitting: Emergency Medicine

## 2023-07-31 DIAGNOSIS — N320 Bladder-neck obstruction: Secondary | ICD-10-CM | POA: Diagnosis not present

## 2023-07-31 DIAGNOSIS — R10A0 Flank pain, unspecified side: Secondary | ICD-10-CM

## 2023-07-31 DIAGNOSIS — R109 Unspecified abdominal pain: Secondary | ICD-10-CM

## 2023-07-31 LAB — CBC WITH DIFFERENTIAL/PLATELET
Abs Immature Granulocytes: 0.01 10*3/uL (ref 0.00–0.07)
Basophils Absolute: 0 10*3/uL (ref 0.0–0.1)
Basophils Relative: 1 %
Eosinophils Absolute: 0.1 10*3/uL (ref 0.0–0.5)
Eosinophils Relative: 1 %
HCT: 38.3 % — ABNORMAL LOW (ref 39.0–52.0)
Hemoglobin: 12.6 g/dL — ABNORMAL LOW (ref 13.0–17.0)
Immature Granulocytes: 0 %
Lymphocytes Relative: 13 %
Lymphs Abs: 0.6 10*3/uL — ABNORMAL LOW (ref 0.7–4.0)
MCH: 30.1 pg (ref 26.0–34.0)
MCHC: 32.9 g/dL (ref 30.0–36.0)
MCV: 91.4 fL (ref 80.0–100.0)
Monocytes Absolute: 0.4 10*3/uL (ref 0.1–1.0)
Monocytes Relative: 7 %
Neutro Abs: 3.8 10*3/uL (ref 1.7–7.7)
Neutrophils Relative %: 78 %
Platelets: 150 10*3/uL (ref 150–400)
RBC: 4.19 MIL/uL — ABNORMAL LOW (ref 4.22–5.81)
RDW: 11.1 % — ABNORMAL LOW (ref 11.5–15.5)
WBC: 4.9 10*3/uL (ref 4.0–10.5)
nRBC: 0 % (ref 0.0–0.2)

## 2023-07-31 LAB — COMPREHENSIVE METABOLIC PANEL
ALT: 31 U/L (ref 0–44)
AST: 25 U/L (ref 15–41)
Albumin: 3.8 g/dL (ref 3.5–5.0)
Alkaline Phosphatase: 51 U/L (ref 38–126)
Anion gap: 8 (ref 5–15)
BUN: 32 mg/dL — ABNORMAL HIGH (ref 6–20)
CO2: 21 mmol/L — ABNORMAL LOW (ref 22–32)
Calcium: 9 mg/dL (ref 8.9–10.3)
Chloride: 110 mmol/L (ref 98–111)
Creatinine, Ser: 1.62 mg/dL — ABNORMAL HIGH (ref 0.61–1.24)
GFR, Estimated: 49 mL/min — ABNORMAL LOW (ref >60)
Glucose, Bld: 121 mg/dL — ABNORMAL HIGH (ref 70–99)
Potassium: 3.9 mmol/L (ref 3.5–5.1)
Sodium: 139 mmol/L (ref 135–145)
Total Bilirubin: 2.7 mg/dL — ABNORMAL HIGH (ref 0.0–1.2)
Total Protein: 6.8 g/dL (ref 6.5–8.1)

## 2023-07-31 LAB — URINALYSIS, ROUTINE W REFLEX MICROSCOPIC
Bilirubin Urine: NEGATIVE
Glucose, UA: NEGATIVE mg/dL
Hgb urine dipstick: NEGATIVE
Ketones, ur: NEGATIVE mg/dL
Leukocytes,Ua: NEGATIVE
Nitrite: NEGATIVE
Protein, ur: NEGATIVE mg/dL
Specific Gravity, Urine: 1.01 (ref 1.005–1.030)
pH: 5 (ref 5.0–8.0)

## 2023-07-31 LAB — LIPASE, BLOOD: Lipase: 30 U/L (ref 11–51)

## 2023-07-31 MED ORDER — SODIUM CHLORIDE 0.9 % IV BOLUS
1000.0000 mL | Freq: Once | INTRAVENOUS | Status: AC
  Administered 2023-07-31: 1000 mL via INTRAVENOUS

## 2023-07-31 MED ORDER — ONDANSETRON HCL 4 MG/2ML IJ SOLN
4.0000 mg | Freq: Once | INTRAMUSCULAR | Status: AC
  Administered 2023-07-31: 4 mg via INTRAVENOUS
  Filled 2023-07-31: qty 2

## 2023-07-31 MED ORDER — KETOROLAC TROMETHAMINE 15 MG/ML IJ SOLN
15.0000 mg | Freq: Once | INTRAMUSCULAR | Status: DC
  Filled 2023-07-31: qty 1

## 2023-07-31 MED ORDER — HYDROMORPHONE HCL 1 MG/ML IJ SOLN
1.0000 mg | Freq: Once | INTRAMUSCULAR | Status: DC
  Filled 2023-07-31: qty 1

## 2023-07-31 NOTE — ED Provider Notes (Signed)
  Physical Exam  BP (!) 167/79 (BP Location: Left Arm)   Pulse 74   Temp 98.2 F (36.8 C) (Oral)   Resp 20   Ht 6\' 3"  (1.905 m)   Wt 86.2 kg   SpO2 99%   BMI 23.75 kg/m   Physical Exam  Procedures  Procedures  ED Course / MDM     See Dr. Lanetta Inch note for prior hx, physical and care.   UA without infection.  Again discussed recommendation for foley catheter placement given increased Cr from prior values, obstructive signs on CT and elevated post-void residual.  Discussed risks of renal failure, including risks that renal failure leads to hyperkalemia and death.  He declines catheter and would like to follow up with Urology. Patient discharged in stable condition with understanding of reasons to return.    Alvira Monday, MD 07/31/23 2130

## 2023-07-31 NOTE — ED Provider Notes (Signed)
 [facility]PARTMENT AT [facility] Provider Note   CSN: [id number] Arrival date & time: 07/31/23  0507     History  Chief Complaint  Patient presents with   Flank Pain    Samuel Roy is a 58 y.o. male.  58 yo M with a chief complaints of right flank pain.  Patient is unable or unwilling to give me any further history.  Just tells me it hurts a lot to his right flank.   Flank Pain       Home Medications Prior to Admission medications   Medication Sig Start Date End Date Taking? Authorizing Provider  albuterol (PROVENTIL HFA;VENTOLIN HFA) 108 (90 Base) MCG/ACT inhaler Inhale 1 puff into the lungs daily as needed for wheezing or shortness of breath.    [provider]  cetirizine-pseudoephedrine (ZYRTEC-D) 5-120 MG tablet Take 1 tablet by mouth daily as needed for allergies.    [provider]  Dexlansoprazole (DEXILANT) 30 MG capsule Take 1 capsule (30 mg total) by mouth daily. 10/21/16   Iva Boop, MD  Fluticasone-Salmeterol (ADVAIR) 100-50 MCG/DOSE AEPB Inhale 1 puff into the lungs 2 (two) times daily.    [provider]  meclizine (ANTIVERT) 25 MG tablet Take 1 tablet (25 mg total) by mouth 3 (three) times daily as needed for dizziness. 04/14/20   Tanda Rockers, PA-C  Multiple Vitamin (MULTIVITAMIN) tablet Take 1 tablet by mouth daily.    [provider]  ondansetron (ZOFRAN ODT) 4 MG disintegrating tablet Take 1 tablet (4 mg total) by mouth every 8 (eight) hours as needed for nausea or vomiting. 04/14/20   Tanda Rockers, PA-C      Allergies    Ampicillin-sulbactam sodium, Erythromycin, and Sulfa antibiotics    Review of Systems   Review of Systems  Genitourinary:  Positive for flank pain.    Physical Exam Updated Vital Signs BP (!) 167/79 (BP Location: Left Arm)   Pulse 74   Temp 98.2 F (36.8 C) (Oral)   Resp 20   Ht 6\' 3"  (1.905 m)   Wt 86.2 kg   SpO2 99%   BMI 23.75 kg/m  Physical  Exam Vitals and nursing note reviewed.  Constitutional:      Appearance: He is well-developed.  HENT:     Head: Normocephalic and atraumatic.  Eyes:     Pupils: Pupils are equal, round, and reactive to light.  Neck:     Vascular: No JVD.  Cardiovascular:     Rate and Rhythm: Normal rate and regular rhythm.     Heart sounds: No murmur heard.    No friction rub. No gallop.  Pulmonary:     Effort: No respiratory distress.     Breath sounds: No wheezing.  Abdominal:     General: There is no distension.     Tenderness: There is no abdominal tenderness. There is no guarding or rebound.  Musculoskeletal:        General: Normal range of motion.     Cervical back: Normal range of motion and neck supple.  Skin:    Coloration: Skin is not pale.     Findings: No rash.  Neurological:     Mental Status: He is alert and oriented to person, place, and time.  Psychiatric:        Behavior: Behavior normal.     ED Results / Procedures / Treatments   Labs (all labs ordered are listed, but only abnormal results are displayed) Labs Reviewed  CBC WITH DIFFERENTIAL/PLATELET - Abnormal; Notable for the following components:      Result Value   RBC 4.19 (*)    Hemoglobin 12.6 (*)    HCT 38.3 (*)    RDW 11.1 (*)    Lymphs Abs 0.6 (*)    All other components within normal limits  COMPREHENSIVE METABOLIC PANEL - Abnormal; Notable for the following components:   CO2 21 (*)    Glucose, Bld 121 (*)    BUN 32 (*)    Creatinine, Ser 1.62 (*)    Total Bilirubin 2.7 (*)    GFR, Estimated 49 (*)    All other components within normal limits  LIPASE, BLOOD  URINALYSIS, ROUTINE W REFLEX MICROSCOPIC    EKG None  Radiology CT Renal Stone Study Result Date: 07/31/2023 CLINICAL DATA:  Abdominal/flank pain on the right EXAM: CT ABDOMEN AND PELVIS WITHOUT CONTRAST TECHNIQUE: Multidetector CT imaging of the abdomen and pelvis was performed following the standard protocol without IV contrast. RADIATION  DOSE REDUCTION: This exam was performed according to the departmental dose-optimization program which includes automated exposure control, adjustment of the mA and/or kV according to patient size and/or use of iterative reconstruction technique. COMPARISON:  None Available. FINDINGS: Lower chest:  No contributory findings. Hepatobiliary: No focal liver abnormality.No evidence of biliary obstruction or stone. Pancreas: Unremarkable. Spleen: Unremarkable. Adrenals/Urinary Tract: Negative adrenals. Bilateral hydroureteronephrosis to the level of the urinary bladder which is distended to nearly the umbilical level. The bladder shows circumferential wall thickening with probable trabeculation. No nephrolithiasis. Stomach/Bowel:  No obstruction. No visible bowel inflammation. Vascular/Lymphatic: No acute vascular abnormality. No mass or adenopathy. Reproductive:Enlarged prostate especially centrally with upward projection into the bladder lumen. Other: No ascites or pneumoperitoneum. Musculoskeletal: No acute abnormalities. IMPRESSION: Urinary retention with bilateral hydronephrosis to the level of the distended urinary bladder. 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 Electronically Signed   By: Tiburcio Pea M.D.   On: 07/31/2023 06:53    Procedures Procedures    Medications Ordered in ED Medications  HYDROmorphone (DILAUDID) injection 1 mg (1 mg Intravenous Patient Refused/Not Given 07/31/23 0610)  ketorolac (TORADOL) 15 MG/ML injection 15 mg (15 mg Intravenous Patient Refused/Not Given 07/31/23 0649)  sodium chloride 0.9 % bolus 1,000 mL (1,000 mLs Intravenous New Bag/Given 07/31/23 0605)  ondansetron (ZOFRAN) injection 4 mg (4 mg Intravenous Given 07/31/23 0607)    ED Course/ Medical Decision Making/ A&P                                 Medical Decision Making Amount and/or Complexity of Data Reviewed Labs: ordered. Radiology: ordered.  Risk Prescription drug  management.   58 yo M with a chief complaint of right flank pain.  Per the nursing notes this started at 9 PM has been severe since.  Patient is unable or unwilling to provide any further history from me.  Will treat pain aggressively here.  Lab work CT imaging reassess.  CT on my independent interpretation with bilateral hydro bladder distention.    Interestingly the patient did not feel like he had to urinate on my repeat assessment.  I did a bedside bladder scan and he had greater than a liter, he did not want have a Foley catheter placed and went and urinated so on postvoid residual he had 560 cc.  I did again encourage him to have a Foley placed and discussed the rationale  for this.  He is currently declining.  He likely has an AKI on his lab work though his last renal function was 4 years ago.  Awaiting UA.  Patient care signed out to Dr. Dalene Seltzer, please see their note for further details of care in the ED.  The patients results and plan were reviewed and discussed.   Any x-rays performed were independently reviewed by myself.   Differential diagnosis were considered with the presenting HPI.  Medications  HYDROmorphone (DILAUDID) injection 1 mg (1 mg Intravenous Patient Refused/Not Given 07/31/23 0610)  ketorolac (TORADOL) 15 MG/ML injection 15 mg (15 mg Intravenous Patient Refused/Not Given 07/31/23 0649)  sodium chloride 0.9 % bolus 1,000 mL (1,000 mLs Intravenous New Bag/Given 07/31/23 0605)  ondansetron (ZOFRAN) injection 4 mg (4 mg Intravenous Given 07/31/23 0607)    Vitals:   07/31/23 0521  BP: (!) 167/79  Pulse: 74  Resp: 20  Temp: 98.2 F (36.8 C)  TempSrc: Oral  SpO2: 99%  Weight: 86.2 kg  Height: 6\' 3"  (1.905 m)    Final diagnoses:  Flank pain  Bladder outlet obstruction           Final Clinical Impression(s) / ED Diagnoses Final diagnoses:  Flank pain  Bladder outlet obstruction    Rx / DC Orders ED Discharge Orders     None         Melene Plan, DO 07/31/23 0720

## 2023-07-31 NOTE — ED Triage Notes (Signed)
 X last night at 9 pm, pt in triage holding right flank. Reports might have kidney stone, has never has one in the past , reports painful urination but no blood in urine. Denies any other urine changes, pain 8/10, denies n/v/d
